# Patient Record
Sex: Female | Born: 1985 | Race: White | Hispanic: No | Marital: Single | Smoking: Never smoker
Health system: Southern US, Community
[De-identification: ages and names within clinical notes are randomized; demographics above are authoritative.]

## PROBLEM LIST (undated history)

## (undated) HISTORY — PX: TONSILLECTOMY: SUR1361

## (undated) HISTORY — PX: MASTECTOMY: SHX3

## (undated) HISTORY — PX: OTHER SURGICAL HISTORY: SHX169

---

## 2017-09-03 ENCOUNTER — Emergency Department (HOSPITAL_BASED_OUTPATIENT_CLINIC_OR_DEPARTMENT_OTHER)
Admission: EM | Admit: 2017-09-03 | Discharge: 2017-09-03 | Disposition: A | Discharge status: Home / Self Care | Payer: BLUE CROSS/BLUE SHIELD | Attending: Emergency Medicine | Admitting: Emergency Medicine

## 2017-09-03 ENCOUNTER — Encounter (HOSPITAL_BASED_OUTPATIENT_CLINIC_OR_DEPARTMENT_OTHER): Payer: Self-pay | Admitting: Emergency Medicine

## 2017-09-03 ENCOUNTER — Emergency Department: Payer: BLUE CROSS/BLUE SHIELD

## 2017-09-03 ENCOUNTER — Emergency Department (HOSPITAL_BASED_OUTPATIENT_CLINIC_OR_DEPARTMENT_OTHER): Payer: BLUE CROSS/BLUE SHIELD

## 2017-09-03 ENCOUNTER — Other Ambulatory Visit: Payer: Self-pay

## 2017-09-03 DIAGNOSIS — Y9389 Activity, other specified: Secondary | ICD-10-CM | POA: Insufficient documentation

## 2017-09-03 DIAGNOSIS — F1210 Cannabis abuse, uncomplicated: Secondary | ICD-10-CM | POA: Insufficient documentation

## 2017-09-03 DIAGNOSIS — S8991XA Unspecified injury of right lower leg, initial encounter: Secondary | ICD-10-CM | POA: Diagnosis present

## 2017-09-03 DIAGNOSIS — M79642 Pain in left hand: Secondary | ICD-10-CM | POA: Diagnosis not present

## 2017-09-03 DIAGNOSIS — S81011A Laceration without foreign body, right knee, initial encounter: Secondary | ICD-10-CM | POA: Diagnosis not present

## 2017-09-03 DIAGNOSIS — Y929 Unspecified place or not applicable: Secondary | ICD-10-CM | POA: Insufficient documentation

## 2017-09-03 DIAGNOSIS — Y998 Other external cause status: Secondary | ICD-10-CM | POA: Diagnosis not present

## 2017-09-03 MED ORDER — BACITRACIN ZINC 500 UNIT/GM EX OINT
TOPICAL_OINTMENT | Freq: Two times a day (BID) | CUTANEOUS | Status: DC
  Administered 2017-09-03: 12:00:00 via TOPICAL

## 2017-09-03 MED ORDER — LIDOCAINE HCL 2 % IJ SOLN
10.0000 mL | Freq: Once | INTRAMUSCULAR | Status: AC
  Administered 2017-09-03: 200 mg
  Filled 2017-09-03: qty 20

## 2017-09-03 NOTE — ED Notes (Signed)
Assumed care of patient from [place], [place]. Pt resting quietly. No distress. No complaints. EDP at bedside.
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
## 2017-09-03 NOTE — Discharge Instructions (Addendum)
Wash the wound on your right knee daily with soap and water and place a thin layer of bacitracin ointment over the wound.  Use the knee immobilizer while walking to prevent stitches from tearing.  Signs of infection include redness around the wound, more swelling, drainage from the wound or fever.  If you think you might be developing an infection, return or see your doctor or an urgent care center.  Take Tylenol or Advil as directed for pain.  Stitches to come out in 1 week.  Stitches can be taken out an urgent care center or at your doctor's office

## 2017-09-03 NOTE — ED Provider Notes (Signed)
Laceration repair of R knee performed by me at the request of Dr. Ethelda ChickJacubowitz  LACERATION REPAIR Performed by: Jo Melton Authorized by: Jo Melton Consent: Verbal consent obtained. Risks and benefits: risks, benefits and alternatives were discussed Consent given by: patient Patient identity confirmed: provided demographic data Prepped and Draped in normal sterile fashion Wound explored  Laceration Location: R knee, anteriorly  Laceration Length: 5cm  No Foreign Bodies seen or palpated  Anesthesia: local infiltration  Local anesthetic: lidocaine 2% w/o epinephrine  Anesthetic total: 9 ml  Irrigation method: syringe Amount of cleaning: standard  Skin closure: prolene 4.0  Number of sutures: 11  Technique: simple interrupted, surgical debridement using sterile scissor, undermining and approximation.  WOund irriated using normal saline 1L.  Wound is superficial with 4mm depth, no bone, ligament, tendon or muscle involvement.   Patient tolerance: Patient tolerated the procedure well with no immediate complications.    Jo Helperran, Kassiah Mccrory, PA-C 09/03/17 1108    Doug SouJacubowitz, Sam, MD 09/03/17 1535
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
## 2017-09-03 NOTE — ED Notes (Signed)
Patient transported to X-ray 
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
